# Patient Record
Sex: Male | Born: 1965 | Race: White | Hispanic: No | Marital: Married | Smoking: Never smoker
Health system: Southern US, Community
[De-identification: ages and names within clinical notes are randomized; demographics above are authoritative.]

## PROBLEM LIST (undated history)

## (undated) DIAGNOSIS — F419 Anxiety disorder, unspecified: Secondary | ICD-10-CM

## (undated) DIAGNOSIS — N29 Other disorders of kidney and ureter in diseases classified elsewhere: Secondary | ICD-10-CM

## (undated) DIAGNOSIS — E7800 Pure hypercholesterolemia, unspecified: Principal | ICD-10-CM

## (undated) DIAGNOSIS — Z981 Arthrodesis status: Secondary | ICD-10-CM

## (undated) DIAGNOSIS — G4700 Insomnia, unspecified: Secondary | ICD-10-CM

## (undated) DIAGNOSIS — I10 Essential (primary) hypertension: Secondary | ICD-10-CM

## (undated) DIAGNOSIS — E349 Endocrine disorder, unspecified: Secondary | ICD-10-CM

## (undated) HISTORY — DX: Essential (primary) hypertension: I10

## (undated) HISTORY — PX: VASECTOMY: SHX75

## (undated) HISTORY — DX: Anxiety disorder, unspecified: F41.9

## (undated) HISTORY — DX: Arthrodesis status: Z98.1

## (undated) HISTORY — PX: CERVICAL FUSION: SHX112

## (undated) HISTORY — DX: Other disorders of calcium metabolism: E83.59

## (undated) HISTORY — DX: Other disorders of kidney and ureter in diseases classified elsewhere: N29

## (undated) HISTORY — DX: Pure hypercholesterolemia, unspecified: E78.00

## (undated) HISTORY — DX: Endocrine disorder, unspecified: E34.9

## (undated) HISTORY — DX: Insomnia, unspecified: G47.00

---

## 2004-07-18 ENCOUNTER — Encounter: Admission: RE | Admit: 2004-07-18 | Discharge: 2004-07-18 | Payer: Self-pay | Admitting: Neurological Surgery

## 2006-12-31 ENCOUNTER — Ambulatory Visit (HOSPITAL_COMMUNITY): Admission: RE | Admit: 2006-12-31 | Discharge: 2007-01-01 | Payer: Self-pay | Admitting: Neurological Surgery

## 2007-01-28 ENCOUNTER — Encounter: Admission: RE | Admit: 2007-01-28 | Discharge: 2007-01-28 | Payer: Self-pay | Admitting: Unknown Physician Specialty

## 2007-04-01 ENCOUNTER — Encounter: Admission: RE | Admit: 2007-04-01 | Discharge: 2007-04-01 | Payer: Self-pay | Admitting: Neurological Surgery

## 2009-10-04 ENCOUNTER — Encounter: Admission: RE | Admit: 2009-10-04 | Discharge: 2009-10-04 | Payer: Self-pay | Admitting: Family Medicine

## 2010-01-14 ENCOUNTER — Encounter: Admission: RE | Admit: 2010-01-14 | Discharge: 2010-01-14 | Payer: Self-pay | Admitting: Family Medicine

## 2010-01-15 ENCOUNTER — Encounter: Admission: RE | Admit: 2010-01-15 | Discharge: 2010-01-15 | Payer: Self-pay | Admitting: Family Medicine

## 2010-02-28 ENCOUNTER — Encounter: Admission: RE | Admit: 2010-02-28 | Discharge: 2010-02-28 | Payer: Self-pay | Admitting: Neurological Surgery

## 2010-11-20 ENCOUNTER — Encounter: Payer: Self-pay | Admitting: Family Medicine

## 2011-03-17 NOTE — Op Note (Signed)
Alfred Dean, Alfred Dean   MEDICAL RECORD NO.:  [id number]          PATIENT TYPE:  AMB   LOCATION:  SDS                          FACILITY:  [facility]   PHYSICIAN:  Tia Alert, MD     DATE OF BIRTH:  April 05, 1966   DATE OF PROCEDURE:  12/31/2006  DATE OF DISCHARGE:                               OPERATIVE REPORT   PREOPERATIVE DIAGNOSES:  Cervical spondylosis with cervical disk  herniation -- C5-6, C6-7, with neck and right arm pain.   POSTOPERATIVE DIAGNOSES:  Cervical spondylosis with cervical disk  herniation -- C5-6, C6-7, with neck and right arm pain.   PROCEDURES:  1. Decompressive anterior cervical diskectomy, C5-6, C6-7.  2. Anterior cervical arthrodesis, C5-6, C6-7; utilizing a 7-mm MTF      corticocancellous allograft at C5-6 and an 8 mm graft at C6-7.  3. Anterior cervical plating, C5-C7; utilizing a 42 mm Venture plate.   SURGEON:  Tia Alert, MD   ASSISTANT:  Kathaleen Maser. Pool, M.D.   ANESTHESIA:  General endotracheal.   COMPLICATIONS:  None apparent.   INDICATIONS FOR PROCEDURE:  Alfred Dean is a 45 year old male who presented  with neck and intermittent right arm pain.  He has had a previous MRI  which showed a soft disk herniation at C6 on the right, and had  undergone epidural steroid injections.  He then had a significant  worsening of his pain and a new MRI showed a new larger disk herniation  at C5-6, with the C6-7 disk becoming much smaller.  I recommended a two-  level ACDF with plating at C5-6 and C6-7.  He had tried medical  management for 18 months without significant relief and had a new disk  herniation.  He understood the risks, benefits and expected outcome; and  he wished to proceed.   DESCRIPTION OF PROCEDURE:  The patient was taken to the operating room,  and after induction of adequate generalized endotracheal anesthesia he  was placed in supine position on the operating room table.  His right  anterior cervical  region was prepped with DuraPrep and then draped in  the usual sterile fashion.  Then 5 mL of local anesthesia was injected  and a transverse incision was incision was made to the right of midline,  and carried down to the platysma -- which was elevated, opened and  undermined with Metzenbaum scissors.  I then dissected in the plane  medial to the sternocleidomastoid muscle, internal carotid artery and  lateral to the trachea and esophagus to expose C5-6 and C6-7.  Intraoperative fluoroscopy confirmed my level, and then the longus colli  muscles were taken down bilaterally and the Shadowline retractors were  placed under these to expose C5-6 and C6-7. The annulus was incised in  both levels, and the initial diskectomy was done with pituitary rongeurs  and curved Carlin curets.  I then used the high-speed drill to drill the  endplates to prepare for later arthrodesis.  We drilled in a rectangular  fashion, widening the disk space to 7 mm at C5-6  and 8 mm at C6-7.  We  drilled down to the level of the posterior and longitudinal ligament.  We brought in the operating microscope, opened the posterior  longitudinal ligament at C5-6 and removed it; while undercutting the  bodies of C5 and C6.  Bilateral foraminotomies were performed, with  particular attention placed to the right side -- where he was  symptomatic.  A large disk herniation was found pre-foraminal on the  right; this was removed.  We then undercut the bodies once again at C5  and C6.  To assure we had adequate decompression of the central canal,  we palpated with a nerve hook into the foramina and into the midline,  and had an adequate decompression.  We could see the cord pulsatile  through the dura.  We then went to the C6-7 and performed the exact same  decompression by opening the posterior longitudinal ligament and then,  undercutting the bodies of C6 and C7, we performed bilateral  foraminotomies.  The C7 nerve roots were  identified, but especially on  the right side where we followed it distally out into the foramen.  Once  we had decompressed this, we found that the dura was no longer pushed  away but was nice and capacious.  Bilateral foraminotomy was performed.  We palpated with a nerve hook into the midline and into the foramina to  assure adequate decompression.  Once we had adequate decompression, we  measured the interspace to be 8 mm.  We placed a 7-mm MTF  corticocancellous allograft into the interspace at C5-6; and an 8 mm  graft at C6-7.  We then used a 42-mm Venture plate and placed two 13 mm  variable angled screws in the bodies of C5, C6 and C7; and these were  locked into the plate by the locking mechanism within the plate.  We  then irrigated with saline solution containing bacitracin.  We dried all  bleeding points with bipolar cautery, and once meticulous hemostasis was  achieved, we closed the platysma with 3-0 Vicryl; closed the  subcuticular tissue with 3-0 Vicryl, and closed the skin with Benzoin  and Steri-Strips.  The drapes were removed.  Sterile dressing was  applied.  The patient was awakened from general anesthesia and  transferred to recovery room in stable condition.  At the end of the  procedure all sponge, needle and instrument counts were correct.      Tia Alert, MD  Electronically Signed     DSJ/MEDQ  D:  12/31/2006  T:  12/31/2006  Job:  [id number]

## 2011-10-26 ENCOUNTER — Ambulatory Visit (HOSPITAL_COMMUNITY)
Admission: RE | Admit: 2011-10-26 | Discharge: 2011-10-26 | Disposition: A | Discharge status: Home / Self Care | Payer: BC Managed Care – PPO | Source: Ambulatory Visit | Attending: Urology | Admitting: Urology

## 2011-10-26 ENCOUNTER — Other Ambulatory Visit (HOSPITAL_COMMUNITY): Payer: Self-pay | Admitting: Urology

## 2011-10-26 DIAGNOSIS — N201 Calculus of ureter: Secondary | ICD-10-CM | POA: Insufficient documentation

## 2011-10-26 DIAGNOSIS — R109 Unspecified abdominal pain: Secondary | ICD-10-CM | POA: Insufficient documentation

## 2011-11-10 ENCOUNTER — Ambulatory Visit (HOSPITAL_COMMUNITY)
Admission: RE | Admit: 2011-11-10 | Discharge: 2011-11-10 | Disposition: A | Discharge status: Home / Self Care | Payer: BC Managed Care – PPO | Source: Ambulatory Visit | Attending: Urology | Admitting: Urology

## 2011-11-10 ENCOUNTER — Other Ambulatory Visit (HOSPITAL_COMMUNITY): Payer: Self-pay | Admitting: Urology

## 2011-11-10 DIAGNOSIS — Z09 Encounter for follow-up examination after completed treatment for conditions other than malignant neoplasm: Secondary | ICD-10-CM | POA: Insufficient documentation

## 2011-11-10 DIAGNOSIS — N201 Calculus of ureter: Secondary | ICD-10-CM

## 2011-12-12 ENCOUNTER — Ambulatory Visit (HOSPITAL_COMMUNITY)
Admission: RE | Admit: 2011-12-12 | Discharge: 2011-12-12 | Disposition: A | Discharge status: Home / Self Care | Payer: BC Managed Care – PPO | Source: Ambulatory Visit | Attending: Urology | Admitting: Urology

## 2011-12-12 ENCOUNTER — Other Ambulatory Visit (HOSPITAL_COMMUNITY): Payer: Self-pay | Admitting: Urology

## 2011-12-12 DIAGNOSIS — N201 Calculus of ureter: Secondary | ICD-10-CM

## 2011-12-12 DIAGNOSIS — R1032 Left lower quadrant pain: Secondary | ICD-10-CM | POA: Insufficient documentation

## 2011-12-12 DIAGNOSIS — R9389 Abnormal findings on diagnostic imaging of other specified body structures: Secondary | ICD-10-CM | POA: Insufficient documentation

## 2011-12-26 ENCOUNTER — Other Ambulatory Visit (HOSPITAL_COMMUNITY): Payer: Self-pay | Admitting: Urology

## 2011-12-26 ENCOUNTER — Ambulatory Visit (HOSPITAL_COMMUNITY)
Admission: RE | Admit: 2011-12-26 | Discharge: 2011-12-26 | Disposition: A | Discharge status: Home / Self Care | Payer: BC Managed Care – PPO | Source: Ambulatory Visit | Attending: Urology | Admitting: Urology

## 2011-12-26 DIAGNOSIS — N201 Calculus of ureter: Secondary | ICD-10-CM | POA: Insufficient documentation

## 2012-02-22 ENCOUNTER — Other Ambulatory Visit: Payer: Self-pay | Admitting: Internal Medicine

## 2012-02-22 DIAGNOSIS — M545 Low back pain: Secondary | ICD-10-CM

## 2012-02-24 ENCOUNTER — Ambulatory Visit
Admission: RE | Admit: 2012-02-24 | Discharge: 2012-02-24 | Disposition: A | Discharge status: Home / Self Care | Payer: BC Managed Care – PPO | Source: Ambulatory Visit | Attending: Internal Medicine | Admitting: Internal Medicine

## 2012-02-24 DIAGNOSIS — M545 Low back pain: Secondary | ICD-10-CM

## 2012-04-23 ENCOUNTER — Other Ambulatory Visit: Payer: Self-pay | Admitting: Neurosurgery

## 2012-04-23 DIAGNOSIS — IMO0002 Reserved for concepts with insufficient information to code with codable children: Secondary | ICD-10-CM

## 2012-04-25 ENCOUNTER — Ambulatory Visit
Admission: RE | Admit: 2012-04-25 | Discharge: 2012-04-25 | Disposition: A | Discharge status: Home / Self Care | Payer: BC Managed Care – PPO | Source: Ambulatory Visit | Attending: Neurosurgery | Admitting: Neurosurgery

## 2012-04-25 DIAGNOSIS — IMO0002 Reserved for concepts with insufficient information to code with codable children: Secondary | ICD-10-CM

## 2015-05-16 ENCOUNTER — Emergency Department (HOSPITAL_COMMUNITY): Payer: 59

## 2015-05-16 ENCOUNTER — Emergency Department (HOSPITAL_COMMUNITY)
Admission: EM | Admit: 2015-05-16 | Discharge: 2015-05-16 | Disposition: A | Discharge status: Home / Self Care | Payer: 59 | Attending: Emergency Medicine | Admitting: Emergency Medicine

## 2015-05-16 ENCOUNTER — Encounter (HOSPITAL_COMMUNITY): Payer: Self-pay | Admitting: Emergency Medicine

## 2015-05-16 DIAGNOSIS — Y990 Civilian activity done for income or pay: Secondary | ICD-10-CM | POA: Insufficient documentation

## 2015-05-16 DIAGNOSIS — Z791 Long term (current) use of non-steroidal anti-inflammatories (NSAID): Secondary | ICD-10-CM | POA: Insufficient documentation

## 2015-05-16 DIAGNOSIS — W1789XA Other fall from one level to another, initial encounter: Secondary | ICD-10-CM | POA: Insufficient documentation

## 2015-05-16 DIAGNOSIS — W19XXXA Unspecified fall, initial encounter: Secondary | ICD-10-CM

## 2015-05-16 DIAGNOSIS — Y9289 Other specified places as the place of occurrence of the external cause: Secondary | ICD-10-CM | POA: Insufficient documentation

## 2015-05-16 DIAGNOSIS — Y9389 Activity, other specified: Secondary | ICD-10-CM | POA: Insufficient documentation

## 2015-05-16 DIAGNOSIS — S199XXA Unspecified injury of neck, initial encounter: Secondary | ICD-10-CM | POA: Diagnosis not present

## 2015-05-16 DIAGNOSIS — S0101XA Laceration without foreign body of scalp, initial encounter: Secondary | ICD-10-CM | POA: Diagnosis not present

## 2015-05-16 DIAGNOSIS — S0990XA Unspecified injury of head, initial encounter: Secondary | ICD-10-CM | POA: Diagnosis present

## 2015-05-16 MED ORDER — LIDOCAINE-EPINEPHRINE (PF) 2 %-1:200000 IJ SOLN
10.0000 mL | Freq: Once | INTRAMUSCULAR | Status: AC
  Administered 2015-05-16: 10 mL via INTRADERMAL

## 2015-05-16 MED ORDER — OXYCODONE-ACETAMINOPHEN 5-325 MG PO TABS: 1.0000 | ORAL_TABLET | Freq: Four times a day (QID) | ORAL | Status: AC | PRN

## 2015-05-16 MED ORDER — HYDROMORPHONE HCL 1 MG/ML IJ SOLN
1.0000 mg | Freq: Once | INTRAMUSCULAR | Status: AC
  Administered 2015-05-16: 1 mg via INTRAVENOUS
  Filled 2015-05-16: qty 1

## 2015-05-16 MED ORDER — CEFAZOLIN SODIUM 1-5 GM-% IV SOLN
1.0000 g | Freq: Once | INTRAVENOUS | Status: AC
  Administered 2015-05-16: 1 g via INTRAVENOUS
  Filled 2015-05-16: qty 50

## 2015-05-16 MED ORDER — CEPHALEXIN 500 MG PO CAPS: 500.0000 mg | ORAL_CAPSULE | Freq: Three times a day (TID) | ORAL | Status: AC

## 2015-05-16 MED ORDER — DIAZEPAM 5 MG/ML IJ SOLN
5.0000 mg | Freq: Once | INTRAMUSCULAR | Status: AC
  Administered 2015-05-16: 5 mg via INTRAVENOUS
  Filled 2015-05-16: qty 2

## 2015-05-16 MED ORDER — LIDOCAINE HCL 2 % IJ SOLN: 5.0000 mL | Freq: Once | INTRAMUSCULAR | Status: DC

## 2015-05-16 MED ORDER — CYCLOBENZAPRINE HCL 5 MG PO TABS
10.0000 mg | ORAL_TABLET | Freq: Three times a day (TID) | ORAL | Status: AC | PRN
Start: 2015-05-16 — End: ?

## 2015-05-16 MED ORDER — MORPHINE SULFATE 4 MG/ML IJ SOLN
4.0000 mg | Freq: Once | INTRAMUSCULAR | Status: AC
  Administered 2015-05-16: 4 mg via INTRAVENOUS
  Filled 2015-05-16: qty 1

## 2015-05-16 NOTE — ED Provider Notes (Signed)
CSN: [id number]     Arrival date & time 05/16/15  1722 History   First MD Initiated Contact with Patient 05/16/15 1730     Chief Complaint  Patient presents with  . Fall     (Consider location/radiation/quality/duration/timing/severity/associated sxs/prior Treatment) The history is provided by the patient.  Shona Needlesllan Mccluskey is a 49 y.o. male hx of cervical fusion here with fall. He was working on his deck and felt that 15 foot and landed on his head. He had the laceration the back of his head as well as some neck pain radiating to the right shoulder. Tetanus is up-to-date. Patient denies loss of consciousness or syncope. Denies chest pain or abdominal pain or other extremity pain. Came by EMS and was given 10 mg morphine en route.    History reviewed. No pertinent past medical history. Past Surgical History  Procedure Laterality Date  . Cervical fusion     No family history on file. History  Substance Use Topics  . Smoking status: Never Smoker   . Smokeless tobacco: Not on file  . Alcohol Use: Yes     Comment: social    Review of Systems  Musculoskeletal: Positive for neck pain.  Skin: Positive for wound.  All other systems reviewed and are negative.     Allergies  Review of patient's allergies indicates no known allergies.  Home Medications   Prior to Admission medications   Medication Sig Start Date End Date Taking? Authorizing Provider  HYDROcodone-acetaminophen (NORCO) 10-325 MG per tablet Take 2 tablets by mouth every 8 (eight) hours as needed for moderate pain.  05/03/15  Yes Historical Provider, MD  meloxicam (MOBIC) 7.5 MG tablet Take 7.5 mg by mouth daily. With food or milk 03/03/15  Yes Historical Provider, MD  testosterone cypionate (DEPOTESTOTERONE CYPIONATE) 100 MG/ML injection Inject 100 mg into the muscle once a week.  05/15/15  Yes Historical Provider, MD   BP 138/82 mmHg  Pulse 90  Temp(Src) 99.3 F (37.4 C) (Oral)  Resp 15  Ht 5\' 11"  (1.803 m)  Wt 230 lb  (104.327 kg)  BMI 32.09 kg/m2  SpO2 94% Physical Exam  Constitutional: He is oriented to person, place, and time. He appears well-developed and well-nourished.  HENT:  Head: Normocephalic.  Mouth/Throat: Oropharynx is clear and moist.  3 inch complex laceration posterior scalp.   Eyes: Conjunctivae are normal. Pupils are equal, round, and reactive to light.  Neck:  C collar in place   Cardiovascular: Normal rate, regular rhythm and normal heart sounds.   Pulmonary/Chest: Effort normal and breath sounds normal. No respiratory distress. He has no wheezes. He has no rales.  Abdominal: Soft. Bowel sounds are normal. He exhibits no distension. There is no tenderness. There is no rebound.  Musculoskeletal: Normal range of motion. He exhibits no edema or tenderness.  Pelvis stable. Nl ROM bilateral hips. No obvious extremity trauma. Dry blood on hands but no obvious laceration   Neurological: He is alert and oriented to person, place, and time. No cranial nerve deficit. Coordination normal.  Nl strength throughout. Nl sensation throughout.   Skin: Skin is warm and dry.  Psychiatric: He has a normal mood and affect. His behavior is normal. Judgment and thought content normal.  Nursing note and vitals reviewed.   ED Course  Procedures (including critical care time)   LACERATION REPAIR Performed by: Chaney MallingYAO, DAVID Authorized by: Chaney MallingYAO, DAVID Consent: Verbal consent obtained. Risks and benefits: risks, benefits and alternatives were discussed Consent given by: patient  Patient identity confirmed: provided demographic data Prepped and Draped in normal sterile fashion Wound explored  Laceration Location: scalp  Laceration Length: 10 cm  No Foreign Bodies seen or palpated  Anesthesia: local infiltration  Local anesthetic: lidocaine 2 % with epinephrine  Anesthetic total: 10 ml  Irrigation method: syringe Amount of cleaning: standard  Skin closure: multiple layer  Number of sutures:  11 4-0 ethilon, 2 4-0 vicryl deep stiches  Technique: simple interrupted   Patient tolerance: Patient tolerated the procedure well with no immediate complications.   Labs Review Labs Reviewed - No data to display  Imaging Review Ct Head Wo Contrast  05/16/2015   CLINICAL DATA:  Fall from deck approximately 15 feet with neck pain and headaches, initial encounter  EXAM: CT HEAD WITHOUT CONTRAST  CT CERVICAL SPINE WITHOUT CONTRAST  TECHNIQUE: Multidetector CT imaging of the head and cervical spine was performed following the standard protocol without intravenous contrast. Multiplanar CT image reconstructions of the cervical spine were also generated.  COMPARISON:  None.  FINDINGS: CT HEAD FINDINGS  Bony calvarium is intact. A posterior laceration is noted just the left of the midline. A few small radiopaque foreign bodies are noted within the laceration. No findings to suggest acute hemorrhage, acute infarction or space-occupying mass lesion are noted.  CT CERVICAL SPINE FINDINGS  Seven cervical segments are well visualized. Vertebral body height is well maintained. Changes consistent with prior fusion at C5-6 and C6-7 with anterior fixation are noted. No acute fracture or acute facet abnormality is noted.  IMPRESSION: CT of the head: Soft tissue laceration posteriorly as described. No acute intracranial abnormality noted.  CT of the cervical spine: Postsurgical changes. No acute abnormality noted.   Electronically Signed   By: Alcide Clever M.D.   On: 05/16/2015 18:46   Ct Cervical Spine Wo Contrast  05/16/2015   CLINICAL DATA:  Fall from deck approximately 15 feet with neck pain and headaches, initial encounter  EXAM: CT HEAD WITHOUT CONTRAST  CT CERVICAL SPINE WITHOUT CONTRAST  TECHNIQUE: Multidetector CT imaging of the head and cervical spine was performed following the standard protocol without intravenous contrast. Multiplanar CT image reconstructions of the cervical spine were also generated.   COMPARISON:  None.  FINDINGS: CT HEAD FINDINGS  Bony calvarium is intact. A posterior laceration is noted just the left of the midline. A few small radiopaque foreign bodies are noted within the laceration. No findings to suggest acute hemorrhage, acute infarction or space-occupying mass lesion are noted.  CT CERVICAL SPINE FINDINGS  Seven cervical segments are well visualized. Vertebral body height is well maintained. Changes consistent with prior fusion at C5-6 and C6-7 with anterior fixation are noted. No acute fracture or acute facet abnormality is noted.  IMPRESSION: CT of the head: Soft tissue laceration posteriorly as described. No acute intracranial abnormality noted.  CT of the cervical spine: Postsurgical changes. No acute abnormality noted.   Electronically Signed   By: Alcide Clever M.D.   On: 05/16/2015 18:46     EKG Interpretation None      MDM   Final diagnoses:  None    Eliav Mechling is a 49 y.o. male here with scalp laceration s/p fall. Tdap up to date. Will do CT head/neck due to mechanism. No signs of chest or abdomen trauma. Will suture laceration.   8:47 PM CT showed no fractures or bleed. Laceration repaired. Ambulated in the ED. Will dc home with percocet, flexeril.       Onalee Hua  Jackie Plum, MD 05/16/15 2048

## 2015-05-16 NOTE — ED Notes (Signed)
Per EMS, pt fell off of a deck approximately 15 feet striking his back and the back of his head. Pt reports intense neck pain on the right side radiating to the right shoulder. Pt has a 3 inch laceration to the back of his head. Pt received  of morphine in route. Pt alert x4. Pt denies blood thinners. NAD at this time.

## 2015-05-16 NOTE — ED Notes (Signed)
Patient transported to CT 

## 2015-05-16 NOTE — Discharge Instructions (Signed)
Take motrin for pain.   Take percocet for severe pain.   Take flexeril for muscle spasms.   Take keflex to prevent infection.   Suture removal in a week.   Follow up with your doctor.   Return to ER if you have severe neck pain, trouble walking, vomiting.

## 2016-09-25 ENCOUNTER — Encounter: Payer: Self-pay | Admitting: *Deleted

## 2016-09-25 DIAGNOSIS — E7800 Pure hypercholesterolemia, unspecified: Secondary | ICD-10-CM

## 2016-09-25 DIAGNOSIS — Z981 Arthrodesis status: Secondary | ICD-10-CM

## 2016-09-25 DIAGNOSIS — G4700 Insomnia, unspecified: Secondary | ICD-10-CM | POA: Insufficient documentation

## 2016-09-25 DIAGNOSIS — F419 Anxiety disorder, unspecified: Secondary | ICD-10-CM

## 2016-09-25 DIAGNOSIS — N29 Other disorders of kidney and ureter in diseases classified elsewhere: Secondary | ICD-10-CM

## 2016-09-25 DIAGNOSIS — E349 Endocrine disorder, unspecified: Secondary | ICD-10-CM

## 2016-09-25 DIAGNOSIS — I10 Essential (primary) hypertension: Secondary | ICD-10-CM

## 2016-09-25 HISTORY — DX: Other disorders of calcium metabolism: E83.59

## 2016-09-25 HISTORY — DX: Essential (primary) hypertension: I10

## 2016-09-25 HISTORY — DX: Endocrine disorder, unspecified: E34.9

## 2016-09-25 HISTORY — DX: Anxiety disorder, unspecified: F41.9

## 2016-09-25 HISTORY — DX: Arthrodesis status: Z98.1

## 2016-09-25 HISTORY — DX: Pure hypercholesterolemia, unspecified: E78.00

## 2016-09-25 HISTORY — DX: Insomnia, unspecified: G47.00

## 2016-09-26 ENCOUNTER — Ambulatory Visit: Payer: Self-pay | Admitting: Rheumatology

## 2016-09-26 NOTE — Progress Notes (Deleted)
   Office Visit Note  Patient: Alfred Dean             Date of Birth: 07-17-1966           MRN: [id number]             PCP: Kirstie PeriSHAH,ASHISH, MD Referring: Kirstie PeriShah, Ashish, MD Visit Date: 09/26/2016 Occupation: @GUAROCC @    Subjective:  No chief complaint on file.   History of Present Illness: Alfred Dean is a 50 y.o. male ***   Activities of Daily Living:  Patient reports morning stiffness for *** {minute/hour:19697}.   Patient {ACTIONS;DENIES/REPORTS:21021675::"Denies"} nocturnal pain.  Difficulty dressing/grooming: {ACTIONS;DENIES/REPORTS:21021675::"Denies"} Difficulty climbing stairs: {ACTIONS;DENIES/REPORTS:21021675::"Denies"} Difficulty getting out of chair: {ACTIONS;DENIES/REPORTS:21021675::"Denies"} Difficulty using hands for taps, buttons, cutlery, and/or writing: {ACTIONS;DENIES/REPORTS:21021675::"Denies"}   No Rheumatology ROS completed.   PMFS History:  Patient Active Problem List   Diagnosis Date Noted  . Hypertension 09/25/2016  . High cholesterol 09/25/2016  . 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 Testosterone deficiency 09/25/2016  . Insomnia 09/25/2016  . Anxiety 09/25/2016  . Hx of fusion of cervical spine 09/25/2016    Past Medical History:  Diagnosis Date  . Anxiety 09/25/2016  . High cholesterol 09/25/2016  . Hx of fusion of cervical spine 09/25/2016  . Hypertension 09/25/2016  . Insomnia 09/25/2016  . 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 Testosterone deficiency 09/25/2016    No family history on file. Past Surgical History:  Procedure Laterality Date  . CERVICAL FUSION    . VASECTOMY     Social History   Social History Narrative  . No narrative on file     Objective: Vital Signs: There were no vitals taken for this visit.   Physical Exam   Musculoskeletal Exam: ***  CDAI Exam: No CDAI exam completed.    Investigation: No additional findings.   Imaging: No results found.  Speciality Comments: No specialty comments  available.    Procedures:  No procedures performed Allergies: Patient has no known allergies.   Assessment / Plan:     Visit Diagnoses: No diagnosis found.    Orders: No orders of the defined types were placed in this encounter.  No orders of the defined types were placed in this encounter.   Face-to-face time spent with patient was *** minutes. 50% of time was spent in counseling and coordination of care.  Follow-Up Instructions: No Follow-up on file.   Tawni PummelNaitik Mario Coronado, PA-C

## 2017-01-23 IMAGING — CT CT HEAD W/O CM
4 of 6 series · 20 of 47 positions shown, 22 images · non-contrast
Comparison: None.

CLINICAL DATA: Fall from deck approximately 15 feet with neck pain
and headaches, initial encounter

EXAM:
CT HEAD WITHOUT CONTRAST
CT CERVICAL SPINE WITHOUT CONTRAST
TECHNIQUE: Multidetector CT imaging of the head and cervical spine was
performed following the standard protocol without intravenous
contrast. Multiplanar CT image reconstructions of the cervical spine
were also generated.

[Series 302: soft tissue, idose (2) · axial · 0.39mm/px · z∈[+713,+881]mm · 8 of 110 slices shown, 10 images]
[im 13/110  brain]
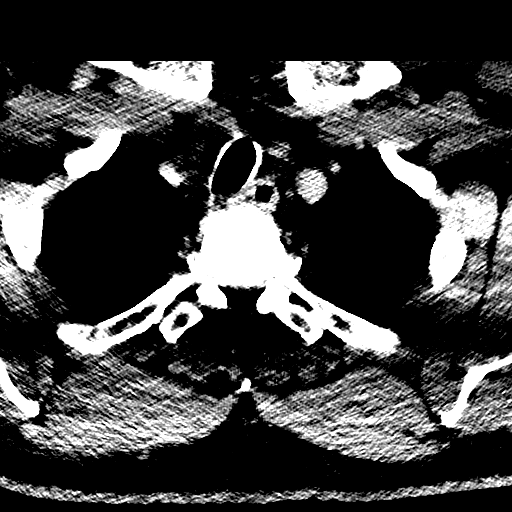
[im 13/110  bone]
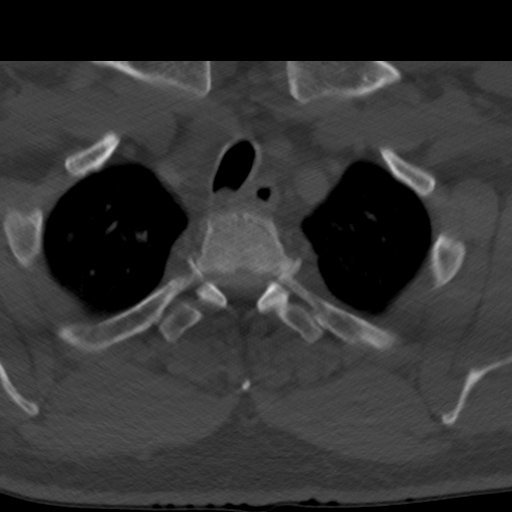
[im 25/110  brain]
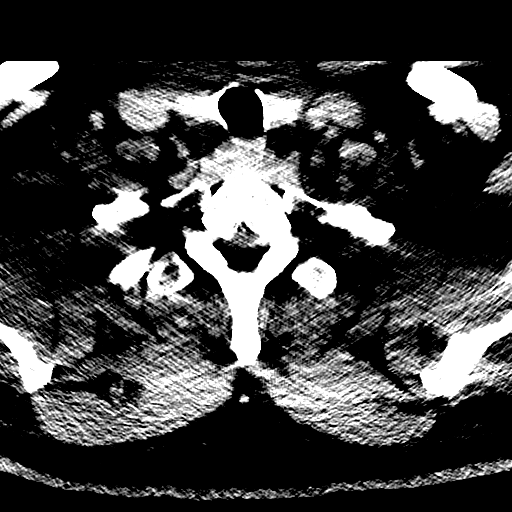
[im 37/110  brain]
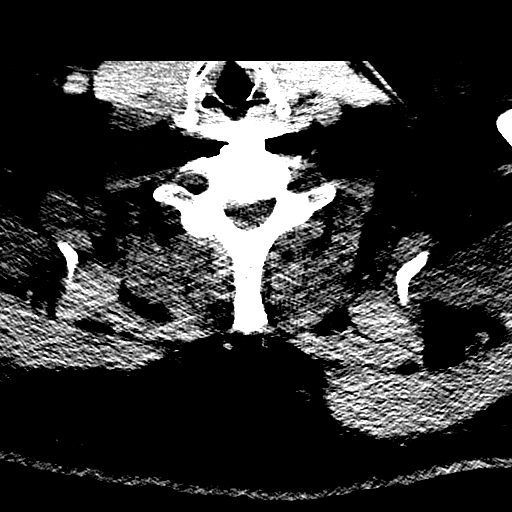
[im 49/110  brain]
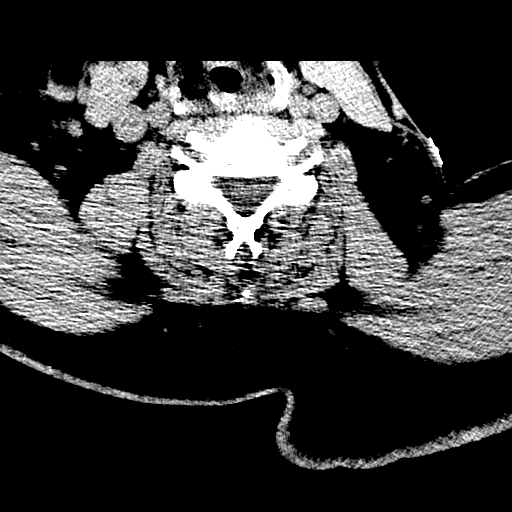
[im 61/110  brain]
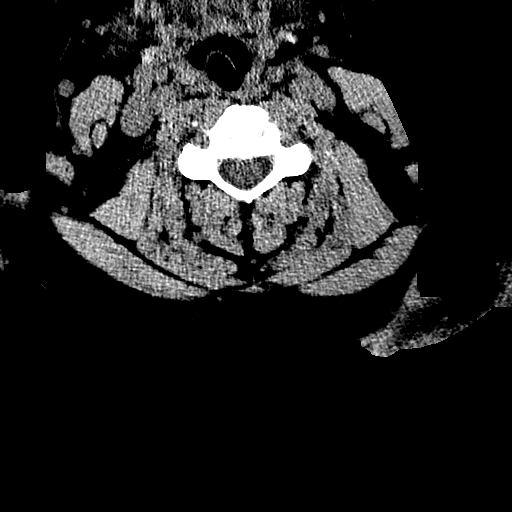
[im 61/110  bone]
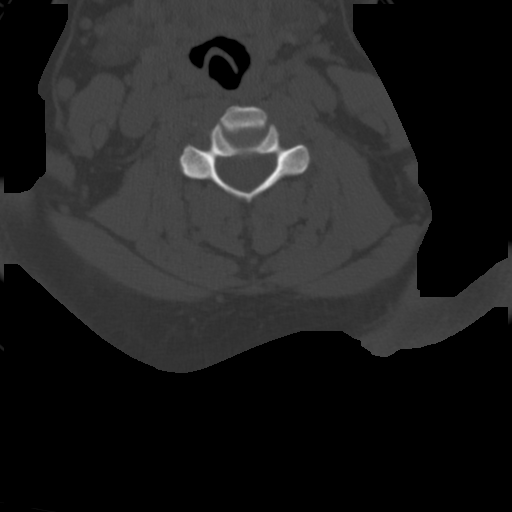
[im 73/110  brain]
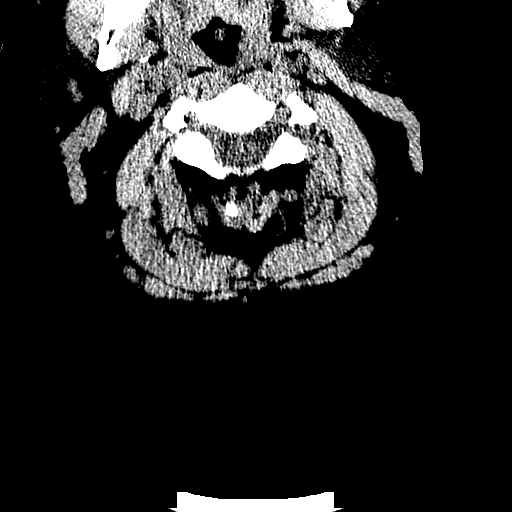
[im 85/110  brain]
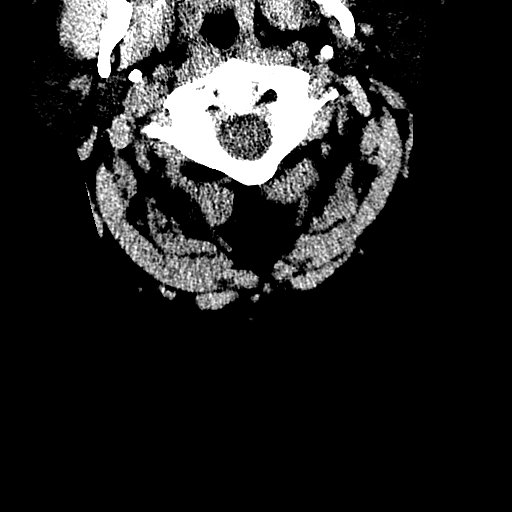
[im 97/110  brain]
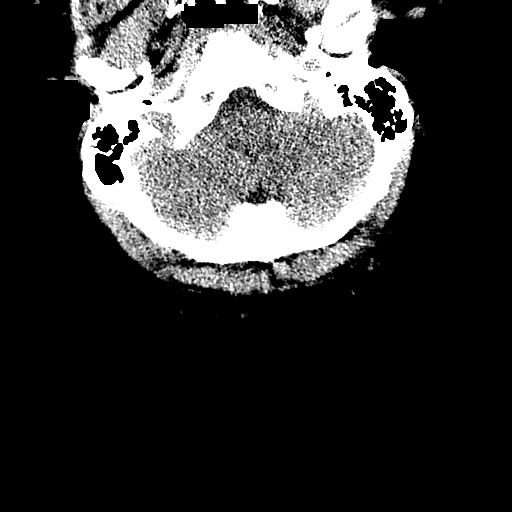

[Series 304: coronal, idose (2) · coronal · 0.35mm/px · 3 of 71 slices shown]
[im 24/71  brain]
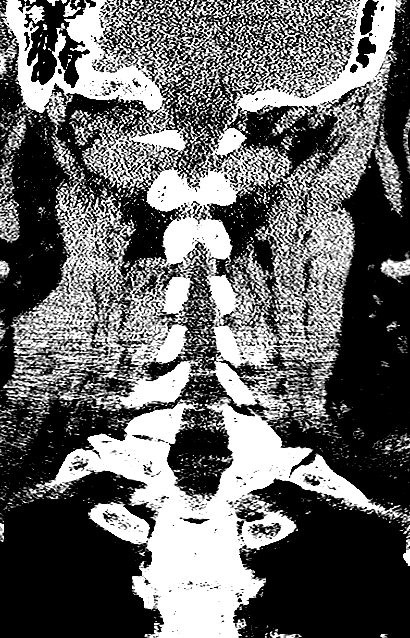
[im 32/71  brain]
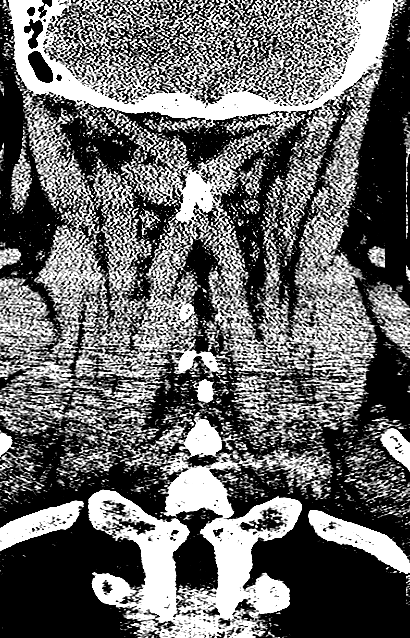
[im 39/71  brain]
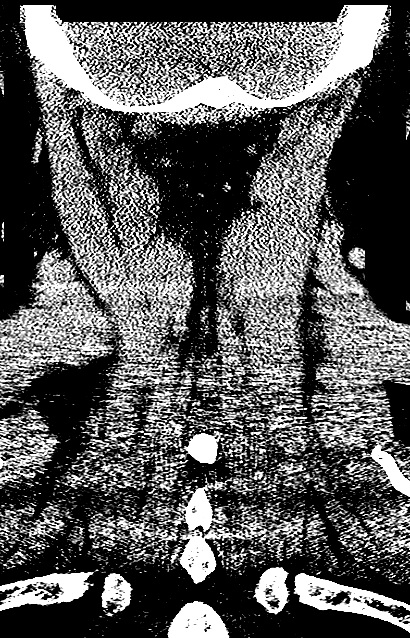

[Series 305: sagittal, idose (2) · sagittal · 0.35mm/px · 3 of 70 slices shown]
[im 24/70  brain]
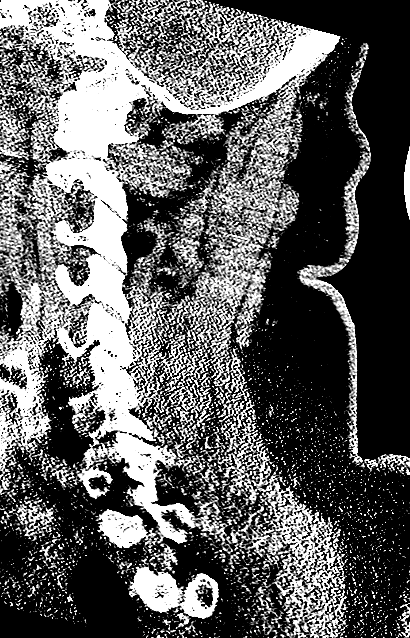
[im 35/70  brain]
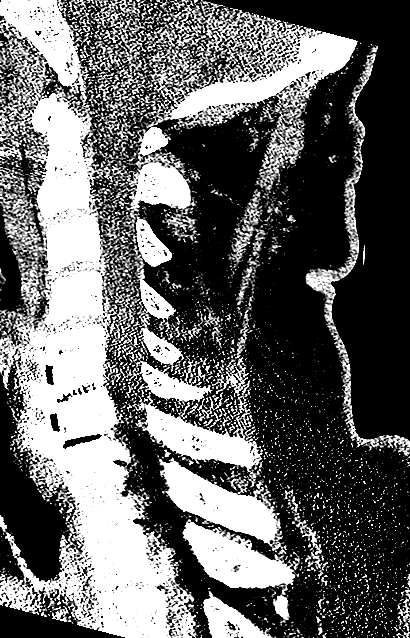
[im 47/70  brain]
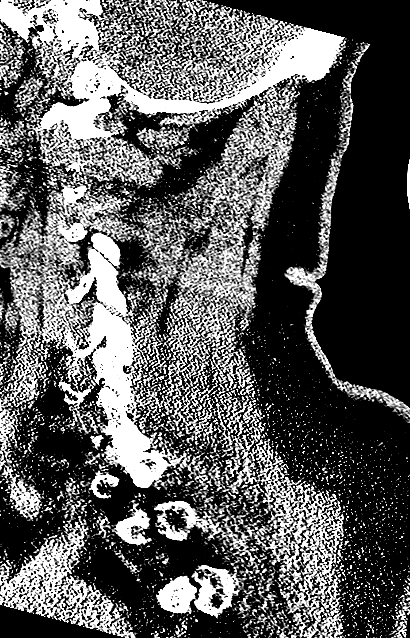

[Series 306: orthogonals, idose (2) · axial · 0.38mm/px · z∈[+703,+819]mm · 6 of 110 slices shown]
[im 13/110  brain]
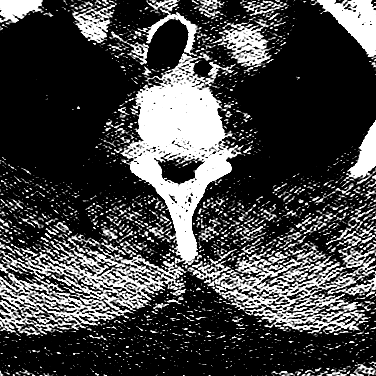
[im 25/110  brain]
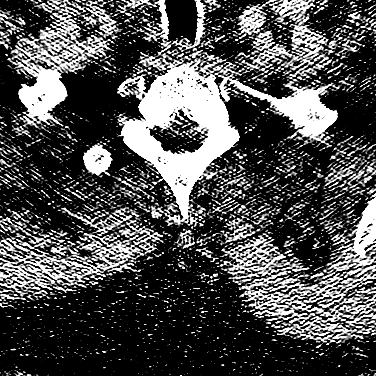
[im 37/110  brain]
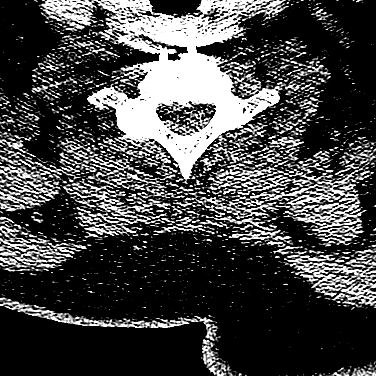
[im 49/110  brain]
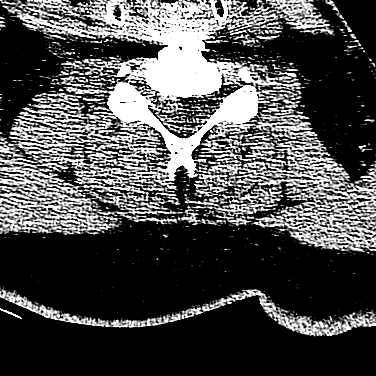
[im 61/110  brain]
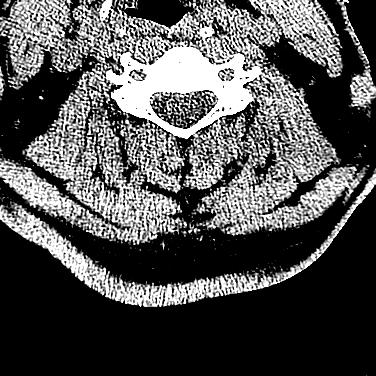
[im 73/110  brain]
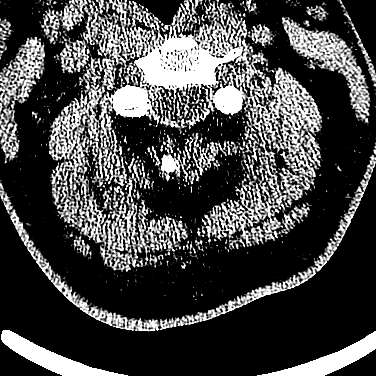

[20 of 47 positions shown; findings below may reference images not displayed]

FINDINGS: CT HEAD FINDINGS

Bony calvarium is intact. A posterior laceration is noted just the
left of the midline. A few small radiopaque foreign bodies are noted
within the laceration. No findings to suggest acute hemorrhage,
acute infarction or space-occupying mass lesion are noted.

CT CERVICAL SPINE FINDINGS

Seven cervical segments are well visualized. Vertebral body height
is well maintained. Changes consistent with prior fusion at C5-6 and
C6-7 with anterior fixation are noted. No acute fracture or acute
facet abnormality is noted.
IMPRESSION: CT of the head: Soft tissue laceration posteriorly as described. No
acute intracranial abnormality noted.

CT of the cervical spine: Postsurgical changes. No acute abnormality
noted.
